# Patient Record
Sex: Female | Born: 1980 | Race: White | Hispanic: No | Marital: Married | Smoking: Never smoker
Health system: Southern US, Community
[De-identification: ages and names within clinical notes are randomized; demographics above are authoritative.]

## PROBLEM LIST (undated history)

## (undated) DIAGNOSIS — Z789 Other specified health status: Secondary | ICD-10-CM

## (undated) HISTORY — PX: AUGMENTATION MAMMAPLASTY: SUR837

## (undated) HISTORY — PX: BREAST ENHANCEMENT SURGERY: SHX7

---

## 2009-04-10 ENCOUNTER — Emergency Department (HOSPITAL_COMMUNITY): Admission: EM | Admit: 2009-04-10 | Discharge: 2009-04-10 | Payer: Self-pay | Admitting: Family Medicine

## 2009-05-22 ENCOUNTER — Emergency Department (HOSPITAL_COMMUNITY): Admission: EM | Admit: 2009-05-22 | Discharge: 2009-05-22 | Payer: Self-pay | Admitting: Family Medicine

## 2010-07-27 LAB — POCT RAPID STREP A (OFFICE): Streptococcus, Group A Screen (Direct): NEGATIVE

## 2012-02-04 ENCOUNTER — Ambulatory Visit (HOSPITAL_COMMUNITY)
Admission: RE | Admit: 2012-02-04 | Discharge: 2012-02-04 | Disposition: A | Discharge status: Home / Self Care | Payer: BC Managed Care – PPO | Source: Ambulatory Visit | Attending: Obstetrics and Gynecology | Admitting: Obstetrics and Gynecology

## 2012-02-04 NOTE — Progress Notes (Addendum)
Adult Lactation Consultation Outpatient Visit Note  Patient Name: Christy George Date of Birth: [birth year] Gestational Age at Delivery: Unknown                     66 3/7 gestational age .  Type of Delivery: per mom Vaginal delivery on 9/22 1143 am @[facility] in [place]. Pedis Dr. Dr, Renae Fickle. [facility] )                              Presents at consult today due to engorgement , breast feeding with implants , and difficult latch at time and sluggish with feedings.                             Per mom I got implants both breast ( and I don't know how they were done). I did have breast changes in the early part of my                              Pregnancy , my nipples got darker. In the hospital - I was told to supplement 20 ml after each feeding ( last day increased to 30 ml                              Until nathan got more consistent with latching.  Breastfeeding History:                                                      Birth  Weight =5-16 oz                                                                                                D/C weight = 5-11 oz                                                                                                 1st Pedis visit =5-8 oz             Per mom  milk came in Last evening.  Frequency of Breastfeeding: Per mom feeding at the breast and when "Harrold Donath" won't latch,I  feed from a Medela bottle ( slow flow)and pump  With a Medela DEBP pump.   Length of Feeding:  Voids:@least  5  Stools: 3-4 greenish today , today yellow   Supplementing / Method: Pumping:  Type of Pump:DEBP Medela( Latina )    Frequency:when the baby doesn't latch   Volume: Per mom this am right breast = 22 ml , Left= 17 ml   Comments: Mom aware due to implants and small infant and use of nipple shield she will need to do extra pumping .                      Also to use the SNS at the breast until the  baby is more consistent. ( instructed both mom and dad with demonstration.     Consultation Evaluation: Mom appears very tired with dark circles under her eyes( she mentioned no sleep since yesterday ).                                             Both breast full to mostly firm laterally and extending throughout the breast. Areola not compress able at the                                              Beginning of consult. Nipples ( right appears abrased , skin intact ( questional old scab ),left appears                                              Healthier.                                              Noted Infant to be sleepy at 1st and when changed weighed, got skin to skin started waking up .                                                Last feeding prior to consult per mom was at 0800 - 10 ml EBM and 20 ml formula in a Medela slow flow bottle.  Initial Feeding Assessment: Left breast  Pre-feed Weight:5.7.5 oz 2480g  Post-feed Weight:5.7.8 oz 2488g  Amount Transferred:8 ml ( 8-10 ml from SNS -lost some on the side of infants mouth )   Comments: Prior to latch had to "ICE" mom due to engorgement 15- 20 mins and use DEBP for 7- 10 mins with 26 ml milk yield . Breast softer, and nipple areola more compress able for latch. @ 1st attempted latched left breast after breast massage , hand express , and it was an on and off pattern. LC noted swallows with feeding but could not sustain the latch even with the LC and mom compressing the breast tissue. Joyce Copa appetizer of EBM with a Dropper challenging him to open wide and stick out his tongue, ( which he did well ). Then latched , still unable to sustain  latch. Added #20 Nipple shield and SNS and noted more consistent  pattern with increased swallows. Latched and breast fed for about 10 -12 mins and unlatched.   Additional Feeding Assessment: Relatched on left breast - still with firm nodules.  Pre-feed Weight: 5-7.8 oz 2488g  Post-feed  Weight:5.7.8 oz  2490g  Amount Transferred:28ml  Comments: Relatched with the nipple shield and not the SNS , sustained a latch but did not say latched long and only transferred 2 ml.   Additional Feeding Assessment: Right breast        - relatched again without the SNS and took 2 ml from the breast  5.8.4 oz 2506g  Pre-feed Weight:5.7.8 oz 2490g  Post-feed Weight: 5.8.3 oz 2504g  Amount Transferred:14 ml - ( 7 of milk transferred from the breast through the nipple shield - and 7ml EBM from the SNS )  Comments: Infant latched well onto the right breast with nipple shield #20 . And stayed in a consistent swallowing pattern with multiply swallows Mom reports comfort and breast softer.   Total Breast milk Transferred this Visit: 26 ml ( 11 ml from the breast and 15 ml EBM from SNS )  Total Supplement Given: 15 of EBM with SNS                                        Discussed with mom the importance of going home and getting a nap to help with letdown and engorgement tx .( see plan                                       Below.    Lactation Plan of Care: 1) Naps, rest , plenty fluids ( esp H2O ) , Nutritious snacks and meals                                        2) Skin to skin feedings                                        3) Feedings - Every 2-3 hours and on demand ( cluster feedings are normal )                                        4) Feeding Diary until Harrold Donath is back up to birth  weight and gaining well.                                        5)Sore nipple tx - Expressed milk to nipples around feedings , and in between.                                        6) Comfort gels x6 days  7) Engorgement tx Full is normal - With breast implants may need to express milk more often if Harrold Donath isn't helping you.                                       8) Goal - is to keep breast comfortable - - If greater than full and heading towards firm need to ice 15 mins and the  follow                                            Steps.                                        9) Attempt to latch without the nipple shield , if unable to apply nipple shield as shown latch , and may have to use SNS until                                             Your milk is letting down easier.                                        10 ) NIPPLE SHIELD use regards weekly weight check as long as you are needing the nipple shield for latching.                                        11) If breast still feeling full after feeding pump off the fullness.    Follow-Up- 1) Per mom Monday Sept 30 th Smart start visit                      2) Consider [facility] at [facility]                      3) 10/2 Wed. @1030  for F/U LC visit at [facility] for feeding assessment due to weight and nipple shield use.       Kathrin Greathouse 02/04/2012, 12:15 PM

## 2012-02-10 ENCOUNTER — Encounter (HOSPITAL_COMMUNITY): Payer: Self-pay

## 2012-11-18 ENCOUNTER — Other Ambulatory Visit: Payer: Self-pay | Admitting: Orthopedic Surgery

## 2012-11-21 ENCOUNTER — Other Ambulatory Visit: Payer: Self-pay | Admitting: Orthopedic Surgery

## 2012-11-21 ENCOUNTER — Encounter (HOSPITAL_COMMUNITY): Payer: Self-pay | Admitting: Pharmacy Technician

## 2012-11-22 ENCOUNTER — Encounter (HOSPITAL_COMMUNITY): Payer: Self-pay | Admitting: *Deleted

## 2012-11-22 NOTE — Progress Notes (Signed)
Pt denies SOB, chest pain, being under the care of a cardiologist, having any cardiac studies done ( EKG, stress, echo, and cardiac cath) and a recent chest x ray. Pt made aware to stop taking Aspirin and herbal medications. Do not take any NSAIDs ie: Ibuprofen, Advil, Naproxen or any medication containing Aspirin.

## 2012-11-23 ENCOUNTER — Encounter (HOSPITAL_COMMUNITY)
Admission: RE | Disposition: A | Discharge status: Home / Self Care | Payer: Self-pay | Source: Ambulatory Visit | Attending: Orthopedic Surgery

## 2012-11-23 ENCOUNTER — Ambulatory Visit: Payer: Self-pay

## 2012-11-23 ENCOUNTER — Ambulatory Visit (HOSPITAL_COMMUNITY)
Admission: RE | Admit: 2012-11-23 | Discharge: 2012-11-24 | DRG: 865 | Disposition: A | Discharge status: Home / Self Care | Payer: BC Managed Care – PPO | Source: Ambulatory Visit | Attending: Orthopedic Surgery | Admitting: Orthopedic Surgery

## 2012-11-23 ENCOUNTER — Encounter (HOSPITAL_COMMUNITY): Payer: Self-pay | Admitting: Anesthesiology

## 2012-11-23 ENCOUNTER — Ambulatory Visit (HOSPITAL_COMMUNITY): Payer: BC Managed Care – PPO

## 2012-11-23 ENCOUNTER — Encounter (HOSPITAL_COMMUNITY): Payer: Self-pay | Admitting: *Deleted

## 2012-11-23 ENCOUNTER — Ambulatory Visit (HOSPITAL_COMMUNITY): Payer: BC Managed Care – PPO | Admitting: Anesthesiology

## 2012-11-23 DIAGNOSIS — M5412 Radiculopathy, cervical region: Secondary | ICD-10-CM

## 2012-11-23 DIAGNOSIS — M5000 Cervical disc disorder with myelopathy, unspecified cervical region: Secondary | ICD-10-CM | POA: Insufficient documentation

## 2012-11-23 DIAGNOSIS — R1310 Dysphagia, unspecified: Secondary | ICD-10-CM | POA: Insufficient documentation

## 2012-11-23 HISTORY — DX: Other specified health status: Z78.9

## 2012-11-23 HISTORY — PX: ANTERIOR CERVICAL DECOMP/DISCECTOMY FUSION: SHX1161

## 2012-11-23 LAB — COMPREHENSIVE METABOLIC PANEL
Albumin: 4.3 g/dL (ref 3.5–5.2)
BUN: 13 mg/dL (ref 6–23)
Creatinine, Ser: 0.73 mg/dL (ref 0.50–1.10)
Total Protein: 7.4 g/dL (ref 6.0–8.3)

## 2012-11-23 LAB — HCG, SERUM, QUALITATIVE: Preg, Serum: NEGATIVE

## 2012-11-23 LAB — URINALYSIS, ROUTINE W REFLEX MICROSCOPIC
Bilirubin Urine: NEGATIVE
Ketones, ur: NEGATIVE mg/dL
Nitrite: NEGATIVE
pH: 5.5 (ref 5.0–8.0)

## 2012-11-23 LAB — PROTIME-INR
INR: 0.9 (ref 0.00–1.49)
Prothrombin Time: 12 seconds (ref 11.6–15.2)

## 2012-11-23 LAB — CBC WITH DIFFERENTIAL/PLATELET
Basophils Relative: 0 % (ref 0–1)
Eosinophils Absolute: 0.1 10*3/uL (ref 0.0–0.7)
HCT: 43.7 % (ref 36.0–46.0)
Hemoglobin: 15 g/dL (ref 12.0–15.0)
MCH: 31.1 pg (ref 26.0–34.0)
MCHC: 34.3 g/dL (ref 30.0–36.0)
Monocytes Absolute: 0.4 10*3/uL (ref 0.1–1.0)
Monocytes Relative: 7 % (ref 3–12)

## 2012-11-23 LAB — TYPE AND SCREEN: Antibody Screen: NEGATIVE

## 2012-11-23 SURGERY — ANTERIOR CERVICAL DECOMPRESSION/DISCECTOMY FUSION 1 LEVEL
Anesthesia: General

## 2012-11-23 MED ORDER — BISACODYL 5 MG PO TBEC: 5.0000 mg | DELAYED_RELEASE_TABLET | Freq: Every day | ORAL | Status: DC | PRN

## 2012-11-23 MED ORDER — LACTATED RINGERS IV SOLN
INTRAVENOUS | Status: DC | PRN
  Administered 2012-11-23 (×2): via INTRAVENOUS

## 2012-11-23 MED ORDER — HYDROMORPHONE HCL PF 1 MG/ML IJ SOLN
INTRAMUSCULAR | Status: AC
  Administered 2012-11-23: 0.5 mg via INTRAVENOUS
  Filled 2012-11-23: qty 1

## 2012-11-23 MED ORDER — SODIUM CHLORIDE 0.9 % IV SOLN: 250.0000 mL | INTRAVENOUS | Status: DC

## 2012-11-23 MED ORDER — PROPOFOL 10 MG/ML IV BOLUS
INTRAVENOUS | Status: DC | PRN
  Administered 2012-11-23: 140 mg via INTRAVENOUS

## 2012-11-23 MED ORDER — ZOLPIDEM TARTRATE 5 MG PO TABS: 5.0000 mg | ORAL_TABLET | Freq: Every evening | ORAL | Status: DC | PRN

## 2012-11-23 MED ORDER — SODIUM CHLORIDE 0.9 % IJ SOLN: 3.0000 mL | Freq: Two times a day (BID) | INTRAMUSCULAR | Status: DC

## 2012-11-23 MED ORDER — ONDANSETRON HCL 4 MG/2ML IJ SOLN
INTRAMUSCULAR | Status: DC | PRN
  Administered 2012-11-23: 4 mg via INTRAVENOUS

## 2012-11-23 MED ORDER — OXYCODONE HCL 5 MG PO TABS: 5.0000 mg | ORAL_TABLET | Freq: Once | ORAL | Status: DC | PRN

## 2012-11-23 MED ORDER — MENTHOL 3 MG MT LOZG: 1.0000 | LOZENGE | OROMUCOSAL | Status: DC | PRN

## 2012-11-23 MED ORDER — BUPIVACAINE-EPINEPHRINE 0.25% -1:200000 IJ SOLN
INTRAMUSCULAR | Status: DC | PRN
  Administered 2012-11-23: 4 mL

## 2012-11-23 MED ORDER — ALUM & MAG HYDROXIDE-SIMETH 200-200-20 MG/5ML PO SUSP: 30.0000 mL | Freq: Four times a day (QID) | ORAL | Status: DC | PRN

## 2012-11-23 MED ORDER — LIDOCAINE HCL (CARDIAC) 20 MG/ML IV SOLN
INTRAVENOUS | Status: DC | PRN
  Administered 2012-11-23: 60 mg via INTRAVENOUS

## 2012-11-23 MED ORDER — LACTATED RINGERS IV SOLN
INTRAVENOUS | Status: DC
  Administered 2012-11-23: 10:00:00 via INTRAVENOUS

## 2012-11-23 MED ORDER — CEFAZOLIN SODIUM-DEXTROSE 2-3 GM-% IV SOLR
INTRAVENOUS | Status: AC
  Filled 2012-11-23: qty 50

## 2012-11-23 MED ORDER — OXYCODONE HCL 5 MG/5ML PO SOLN: 5.0000 mg | Freq: Once | ORAL | Status: DC | PRN

## 2012-11-23 MED ORDER — LIDOCAINE HCL 4 % MT SOLN
OROMUCOSAL | Status: DC | PRN
  Administered 2012-11-23: 4 mL via TOPICAL

## 2012-11-23 MED ORDER — FENTANYL CITRATE 0.05 MG/ML IJ SOLN
INTRAMUSCULAR | Status: DC | PRN
Start: 2012-11-23 — End: 2012-11-23
  Administered 2012-11-23 (×6): 50 ug via INTRAVENOUS

## 2012-11-23 MED ORDER — THROMBIN 20000 UNITS EX SOLR
CUTANEOUS | Status: AC
  Filled 2012-11-23: qty 20000

## 2012-11-23 MED ORDER — 0.9 % SODIUM CHLORIDE (POUR BTL) OPTIME
TOPICAL | Status: DC | PRN
  Administered 2012-11-23: 1000 mL

## 2012-11-23 MED ORDER — DIAZEPAM 5 MG PO TABS: 5.0000 mg | ORAL_TABLET | Freq: Four times a day (QID) | ORAL | Status: DC | PRN

## 2012-11-23 MED ORDER — CEFAZOLIN SODIUM-DEXTROSE 2-3 GM-% IV SOLR
2.0000 g | INTRAVENOUS | Status: AC
  Administered 2012-11-23: 2 g via INTRAVENOUS

## 2012-11-23 MED ORDER — ONDANSETRON HCL 4 MG/2ML IJ SOLN
4.0000 mg | INTRAMUSCULAR | Status: DC | PRN
  Administered 2012-11-23 – 2012-11-24 (×2): 4 mg via INTRAVENOUS
  Filled 2012-11-23 (×2): qty 2

## 2012-11-23 MED ORDER — PROMETHAZINE HCL 25 MG/ML IJ SOLN: 6.2500 mg | INTRAMUSCULAR | Status: DC | PRN

## 2012-11-23 MED ORDER — ACETAMINOPHEN 650 MG RE SUPP: 650.0000 mg | RECTAL | Status: DC | PRN

## 2012-11-23 MED ORDER — MINERAL OIL LIGHT 100 % EX OIL
TOPICAL_OIL | CUTANEOUS | Status: DC | PRN
  Administered 2012-11-23: 1 via TOPICAL

## 2012-11-23 MED ORDER — PROPOFOL INFUSION 10 MG/ML OPTIME
INTRAVENOUS | Status: DC | PRN
  Administered 2012-11-23: 100 ug/kg/min via INTRAVENOUS

## 2012-11-23 MED ORDER — SUCCINYLCHOLINE CHLORIDE 20 MG/ML IJ SOLN
INTRAMUSCULAR | Status: DC | PRN
  Administered 2012-11-23: 90 mg via INTRAVENOUS

## 2012-11-23 MED ORDER — MORPHINE SULFATE 2 MG/ML IJ SOLN
1.0000 mg | INTRAMUSCULAR | Status: DC | PRN
  Administered 2012-11-23 – 2012-11-24 (×2): 2 mg via INTRAVENOUS
  Filled 2012-11-23 (×2): qty 1

## 2012-11-23 MED ORDER — POVIDONE-IODINE 7.5 % EX SOLN
Freq: Once | CUTANEOUS | Status: DC
Start: 2012-11-23 — End: 2012-11-23
  Filled 2012-11-23: qty 118

## 2012-11-23 MED ORDER — SENNOSIDES-DOCUSATE SODIUM 8.6-50 MG PO TABS: 1.0000 | ORAL_TABLET | Freq: Every evening | ORAL | Status: DC | PRN

## 2012-11-23 MED ORDER — BUPIVACAINE-EPINEPHRINE PF 0.25-1:200000 % IJ SOLN
INTRAMUSCULAR | Status: AC
  Filled 2012-11-23: qty 30

## 2012-11-23 MED ORDER — HYDROMORPHONE HCL PF 1 MG/ML IJ SOLN
0.2500 mg | INTRAMUSCULAR | Status: DC | PRN
  Administered 2012-11-23: 0.5 mg via INTRAVENOUS

## 2012-11-23 MED ORDER — DOCUSATE SODIUM 100 MG PO CAPS
100.0000 mg | ORAL_CAPSULE | Freq: Two times a day (BID) | ORAL | Status: DC
  Administered 2012-11-24 (×2): 100 mg via ORAL
  Filled 2012-11-23 (×4): qty 1

## 2012-11-23 MED ORDER — SODIUM CHLORIDE 0.9 % IJ SOLN: 3.0000 mL | INTRAMUSCULAR | Status: DC | PRN

## 2012-11-23 MED ORDER — MUPIROCIN 2 % EX OINT
TOPICAL_OINTMENT | CUTANEOUS | Status: AC
  Administered 2012-11-23: 11:00:00
  Filled 2012-11-23: qty 22

## 2012-11-23 MED ORDER — FLEET ENEMA 7-19 GM/118ML RE ENEM: 1.0000 | ENEMA | Freq: Once | RECTAL | Status: AC | PRN

## 2012-11-23 MED ORDER — ARTIFICIAL TEARS OP OINT
TOPICAL_OINTMENT | OPHTHALMIC | Status: DC | PRN
  Administered 2012-11-23: 1 via OPHTHALMIC

## 2012-11-23 MED ORDER — ACETAMINOPHEN 325 MG PO TABS: 650.0000 mg | ORAL_TABLET | ORAL | Status: DC | PRN

## 2012-11-23 MED ORDER — CEFAZOLIN SODIUM 1-5 GM-% IV SOLN
1.0000 g | Freq: Three times a day (TID) | INTRAVENOUS | Status: AC
  Administered 2012-11-23 – 2012-11-24 (×2): 1 g via INTRAVENOUS
  Filled 2012-11-23 (×2): qty 50

## 2012-11-23 MED ORDER — MINERAL OIL LIGHT 100 % EX OIL
TOPICAL_OIL | CUTANEOUS | Status: AC
  Filled 2012-11-23: qty 25

## 2012-11-23 MED ORDER — PHENOL 1.4 % MT LIQD
1.0000 | OROMUCOSAL | Status: DC | PRN
  Administered 2012-11-23: 1 via OROMUCOSAL
  Filled 2012-11-23: qty 177

## 2012-11-23 MED ORDER — THROMBIN 20000 UNITS EX SOLR
CUTANEOUS | Status: DC | PRN
  Administered 2012-11-23: 14:00:00

## 2012-11-23 MED ORDER — OXYCODONE-ACETAMINOPHEN 5-325 MG PO TABS
1.0000 | ORAL_TABLET | ORAL | Status: DC | PRN
  Administered 2012-11-24 (×2): 1 via ORAL
  Filled 2012-11-23 (×2): qty 1

## 2012-11-23 MED ORDER — MIDAZOLAM HCL 5 MG/5ML IJ SOLN
INTRAMUSCULAR | Status: DC | PRN
  Administered 2012-11-23 (×2): 1 mg via INTRAVENOUS

## 2012-11-23 SURGICAL SUPPLY — 76 items
ACIS Retainer Pin ×2 IMPLANT
BENZOIN TINCTURE PRP APPL 2/3 (GAUZE/BANDAGES/DRESSINGS) ×2 IMPLANT
BIT DRILL NEURO 2X3.1 SFT TUCH (MISCELLANEOUS) ×1 IMPLANT
BIT DRILL SRG 14X2.2XFLT CHK (BIT) ×1 IMPLANT
BIT DRL SRG 14X2.2XFLT CHK (BIT) ×1
BLADE SURG 15 STRL LF DISP TIS (BLADE) ×1 IMPLANT
BLADE SURG 15 STRL SS (BLADE) ×1
BLADE SURG ROTATE 9660 (MISCELLANEOUS) ×2 IMPLANT
BUR MATCHSTICK NEURO 3.0 LAGG (BURR) ×2 IMPLANT
CARTRIDGE OIL MAESTRO DRILL (MISCELLANEOUS) ×1 IMPLANT
CLOTH BEACON ORANGE TIMEOUT ST (SAFETY) ×2 IMPLANT
CLSR STERI-STRIP ANTIMIC 1/2X4 (GAUZE/BANDAGES/DRESSINGS) ×2 IMPLANT
COLLAR CERV LO CONTOUR FIRM DE (SOFTGOODS) ×2 IMPLANT
CORDS BIPOLAR (ELECTRODE) ×2 IMPLANT
COVER SURGICAL LIGHT HANDLE (MISCELLANEOUS) ×2 IMPLANT
CRADLE DONUT ADULT HEAD (MISCELLANEOUS) ×2 IMPLANT
DIFFUSER DRILL AIR PNEUMATIC (MISCELLANEOUS) ×2 IMPLANT
DRAIN JACKSON RD 7FR 3/32 (WOUND CARE) IMPLANT
DRAPE C-ARM 42X72 X-RAY (DRAPES) ×2 IMPLANT
DRAPE POUCH INSTRU U-SHP 10X18 (DRAPES) ×2 IMPLANT
DRAPE SURG 17X23 STRL (DRAPES) ×6 IMPLANT
DRILL BIT SKYLINE 14MM (BIT) ×1
DRILL NEURO 2X3.1 SOFT TOUCH (MISCELLANEOUS) ×2
DURAPREP 26ML APPLICATOR (WOUND CARE) ×2 IMPLANT
ELECT COATED BLADE 2.86 ST (ELECTRODE) ×2 IMPLANT
ELECT REM PT RETURN 9FT ADLT (ELECTROSURGICAL) ×2
ELECTRODE REM PT RTRN 9FT ADLT (ELECTROSURGICAL) ×1 IMPLANT
EVACUATOR SILICONE 100CC (DRAIN) IMPLANT
GAUZE SPONGE 4X4 16PLY XRAY LF (GAUZE/BANDAGES/DRESSINGS) ×2 IMPLANT
GLOVE BIO SURGEON STRL SZ7 (GLOVE) ×2 IMPLANT
GLOVE BIO SURGEON STRL SZ8 (GLOVE) ×2 IMPLANT
GLOVE BIOGEL PI IND STRL 7.0 (GLOVE) ×2 IMPLANT
GLOVE BIOGEL PI IND STRL 8 (GLOVE) ×1 IMPLANT
GLOVE BIOGEL PI INDICATOR 7.0 (GLOVE) ×2
GLOVE BIOGEL PI INDICATOR 8 (GLOVE) ×1
GOWN SRG XL XLNG 56XLVL 4 (GOWN DISPOSABLE) ×2 IMPLANT
GOWN STRL NON-REIN LRG LVL3 (GOWN DISPOSABLE) ×2 IMPLANT
GOWN STRL NON-REIN XL XLG LVL4 (GOWN DISPOSABLE) ×2
IMPL S ENDOSKEL TC 7 ODEG (Orthopedic Implant) ×1 IMPLANT
IMPLANT S ENDOSKEL TC 7 ODEG (Orthopedic Implant) ×2 IMPLANT
IV CATH 14GX2 1/4 (CATHETERS) ×2 IMPLANT
KIT BASIN OR (CUSTOM PROCEDURE TRAY) ×2 IMPLANT
KIT ROOM TURNOVER OR (KITS) ×2 IMPLANT
MANIFOLD NEPTUNE II (INSTRUMENTS) ×2 IMPLANT
NEEDLE 27GAX1X1/2 (NEEDLE) ×2 IMPLANT
NEEDLE SPNL 20GX3.5 QUINCKE YW (NEEDLE) ×2 IMPLANT
NS IRRIG 1000ML POUR BTL (IV SOLUTION) ×2 IMPLANT
OIL CARTRIDGE MAESTRO DRILL (MISCELLANEOUS) ×2
PACK ORTHO CERVICAL (CUSTOM PROCEDURE TRAY) ×2 IMPLANT
PAD ARMBOARD 7.5X6 YLW CONV (MISCELLANEOUS) ×4 IMPLANT
PATTIES SURGICAL .5 X.5 (GAUZE/BANDAGES/DRESSINGS) IMPLANT
PATTIES SURGICAL .5 X1 (DISPOSABLE) IMPLANT
PIN DISTRACTION 12MM (PIN) ×2
PIN DSTRCT 12XNS SS ACIS (PIN) ×2 IMPLANT
PLATE SKYLINE 12MM (Plate) ×2 IMPLANT
PUTTY BONE DBX 2.5 MIS (Bone Implant) ×2 IMPLANT
SCREW TAPPING SKYLINE 12MM (Screw) ×8 IMPLANT
SPONGE GAUZE 4X4 12PLY (GAUZE/BANDAGES/DRESSINGS) ×2 IMPLANT
SPONGE INTESTINAL PEANUT (DISPOSABLE) ×4 IMPLANT
SPONGE SURGIFOAM ABS GEL 100 (HEMOSTASIS) ×2 IMPLANT
STRIP CLOSURE SKIN 1/2X4 (GAUZE/BANDAGES/DRESSINGS) ×2 IMPLANT
SURGIFLO TRUKIT (HEMOSTASIS) IMPLANT
SUT MNCRL AB 4-0 PS2 18 (SUTURE) ×2 IMPLANT
SUT SILK 4 0 (SUTURE)
SUT SILK 4-0 18XBRD TIE 12 (SUTURE) IMPLANT
SUT VIC AB 2-0 CT2 18 VCP726D (SUTURE) ×4 IMPLANT
SYR BULB IRRIGATION 50ML (SYRINGE) ×2 IMPLANT
SYR CONTROL 10ML LL (SYRINGE) ×4 IMPLANT
Skyline drill ×2 IMPLANT
TAPE CLOTH 4X10 WHT NS (GAUZE/BANDAGES/DRESSINGS) ×2 IMPLANT
TAPE CLOTH SURG 4X10 WHT LF (GAUZE/BANDAGES/DRESSINGS) ×2 IMPLANT
TAPE UMBILICAL COTTON 1/8X30 (MISCELLANEOUS) ×2 IMPLANT
TOWEL OR 17X24 6PK STRL BLUE (TOWEL DISPOSABLE) ×2 IMPLANT
TOWEL OR 17X26 10 PK STRL BLUE (TOWEL DISPOSABLE) ×2 IMPLANT
WATER STERILE IRR 1000ML POUR (IV SOLUTION) ×2 IMPLANT
YANKAUER SUCT BULB TIP NO VENT (SUCTIONS) ×2 IMPLANT

## 2012-11-23 NOTE — Anesthesia Postprocedure Evaluation (Signed)
Anesthesia Post Note  Patient: Christy George  Procedure(s) Performed: Procedure(s) (LRB): ANTERIOR CERVICAL DECOMPRESSION/DISCECTOMY FUSION 1 LEVEL (N/A)  Anesthesia type: general  Patient location: PACU  Post pain: Pain level controlled  Post assessment: Patient's Cardiovascular Status Stable  Last Vitals:  Filed Vitals:   11/23/12 1745  BP: 120/77  Pulse:   Temp: 36.2 C  Resp:     Post vital signs: Reviewed and stable  Level of consciousness: sedated  Complications: No apparent anesthesia complications

## 2012-11-23 NOTE — Transfer of Care (Signed)
Immediate Anesthesia Transfer of Care Note  Patient: Christy George  Procedure(s) Performed: Procedure(s) with comments: ANTERIOR CERVICAL DECOMPRESSION/DISCECTOMY FUSION 1 LEVEL (N/A) - Anterior cervical decompression fusion cervical 6-7 with instrumentation and allograft  Patient Location: PACU  Anesthesia Type:General  Level of Consciousness: awake, oriented and patient cooperative  Airway & Oxygen Therapy: Patient Spontanous Breathing and Patient connected to nasal cannula oxygen  Post-op Assessment: Report given to PACU RN and Post -op Vital signs reviewed and stable  Post vital signs: Reviewed and stable  Complications: No apparent anesthesia complications

## 2012-11-23 NOTE — Anesthesia Procedure Notes (Signed)
Procedure Name: Intubation Date/Time: 11/23/2012 1:11 PM Performed by: Gayla Medicus Pre-anesthesia Checklist: Patient identified, Timeout performed, Emergency Drugs available, Suction available and Patient being monitored Patient Re-evaluated:Patient Re-evaluated prior to inductionOxygen Delivery Method: Circle system utilized Preoxygenation: Pre-oxygenation with 100% oxygen Intubation Type: IV induction Ventilation: Mask ventilation without difficulty Tube size: 7.0 mm Number of attempts: 1 Airway Equipment and Method: Stylet Placement Confirmation: positive ETCO2 and breath sounds checked- equal and bilateral Secured at: 21 cm Tube secured with: Tape Dental Injury: Teeth and Oropharynx as per pre-operative assessment  Comments: Atraumatic blind oral intubation performed by Dr. Jacklynn Bue. Neck maintained in neutral position throughout, +BBSE, +ETCO2.

## 2012-11-23 NOTE — Preoperative (Signed)
Beta Blockers   Reason not to administer Beta Blockers:Not Applicable 

## 2012-11-23 NOTE — H&P (Signed)
PREOPERATIVE H&P  Chief Complaint: sever left arm pain  HPI: Christy George is a 32 y.o. female who presents with ongoing and sever left arm pain. MRi reveals large C6/7 HNP causing severe compression of the spinal cord at this level.  Past Medical History  Diagnosis Date  . Medical history non-contributory    Past Surgical History  Procedure Laterality Date  . Breast enhancement surgery      hx; of 1999   History   Social History  . Marital Status: Married    Spouse Name: N/A    Number of Children: N/A  . Years of Education: N/A   Social History Main Topics  . Smoking status: Never Smoker   . Smokeless tobacco: Never Used  . Alcohol Use: Yes     Comment: occasional  . Drug Use: No  . Sexually Active: Not Currently   Other Topics Concern  . None   Social History Narrative  . None   Family History  Problem Relation Age of Onset  . Cervical cancer Other    No Known Allergies Prior to Admission medications   Medication Sig Start Date End Date Taking? Authorizing Provider  acetaminophen-codeine (TYLENOL #3) 300-30 MG per tablet Take 1 tablet by mouth every 4 (four) hours as needed for pain.   Yes Historical Provider, MD  HYDROcodone-acetaminophen (NORCO/VICODIN) 5-325 MG per tablet Take 1 tablet by mouth at bedtime as needed for pain.   Yes Historical Provider, MD     All other systems have been reviewed and were otherwise negative with the exception of those mentioned in the HPI and as above.  Physical Exam: There were no vitals filed for this visit.  General: Alert, no acute distress Cardiovascular: No pedal edema Respiratory: No cyanosis, no use of accessory musculature GI: No organomegaly, abdomen is soft and non-tender Skin: No lesions in the area of chief complaint Neurologic: Sensation intact distally, weakness noted at left UE Psychiatric: Patient is competent for consent with normal mood and affect Lymphatic: No axillary or cervical  lymphadenopathy  MUSCULOSKELETAL: pain with ROM C spine  Assessment/Plan: Left arm pain, spinal cord compression Plan for Procedure(s): ANTERIOR CERVICAL DECOMPRESSION/DISCECTOMY FUSION C6/7   [name], MD 11/23/2012 8:03 AM

## 2012-11-23 NOTE — Anesthesia Preprocedure Evaluation (Signed)
Anesthesia Evaluation  Patient identified by MRN, date of birth, ID band Patient awake    Reviewed: Allergy & Precautions, H&P , NPO status , Patient's Chart, lab work & pertinent test results  History of Anesthesia Complications Negative for: history of anesthetic complications  Airway Mallampati: II  Neck ROM: limited    Dental no notable dental hx. (+) Teeth Intact   Pulmonary neg pulmonary ROS,  breath sounds clear to auscultation  Pulmonary exam normal       Cardiovascular negative cardio ROS  IRhythm:regular Rate:Normal     Neuro/Psych negative neurological ROS  negative psych ROS   GI/Hepatic negative GI ROS, Neg liver ROS,   Endo/Other  negative endocrine ROS  Renal/GU negative Renal ROS  negative genitourinary   Musculoskeletal   Abdominal   Peds  Hematology negative hematology ROS (+)   Anesthesia Other Findings   Reproductive/Obstetrics negative OB ROS                           Anesthesia Physical Anesthesia Plan  ASA: I  Anesthesia Plan: General and General ETT   Post-op Pain Management:    Induction: Intravenous  Airway Management Planned: Oral ETT  Additional Equipment:   Intra-op Plan:   Post-operative Plan: Extubation in OR  Informed Consent: I have reviewed the patients History and Physical, chart, labs and discussed the procedure including the risks, benefits and alternatives for the proposed anesthesia with the patient or authorized representative who has indicated his/her understanding and acceptance.   Dental advisory given  Plan Discussed with: CRNA and Surgeon  Anesthesia Plan Comments:         Anesthesia Quick Evaluation

## 2012-11-24 NOTE — Op Note (Signed)
NAME:  LOIS, SLAGEL NO.:  [id number]  MEDICAL RECORD NO.:  [id number]  LOCATION:  [id number]                        FACILITY:  [facility]  PHYSICIAN:  Estill Bamberg, MD      DATE OF BIRTH:  [birth year]  DATE OF PROCEDURE:  11/23/2012 DATE OF DISCHARGE:                              OPERATIVE REPORT   PREOPERATIVE DIAGNOSES: 1. Left-sided cervical radiculopathy. 2. 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  POSTOPERATIVE DIAGNOSES: 1. Left-sided cervical radiculopathy. 2. 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  PROCEDURES: 1. Anterior cervical decompression fusion, C6-7. 2. Placement of anterior instrumentation, C6-7. 3. Insertion of interbody device x1 (6 mm parallel small Titan     interbody cage). 4. Use of morselized allograft. 5. Intraoperative use of fluoroscopy.  SURGEON:  Estill Bamberg, MD  ASSISTANT:  Jason Coop, PA-C  ANESTHESIA:  General endotracheal anesthesia.  COMPLICATIONS:  None.  DISPOSITION:  Stable.  ESTIMATED BLOOD LOSS:  Minimal.  INDICATIONS FOR PROCEDURE:  Briefly, Ms. Glasser is a pleasant 32 year old female who did initially present to me with severe pain in her left arm.  I did ultimately obtain an MRI, which was notable for a very large disk herniation, causing severe compression of the spinal cord.  Given nature of the findings, we did have a discussion regarding operative intervention.  She did understand the risks and limitations of the procedure.  OPERATIVE DETAILS:  On November 23, 2012, the patient was brought to the surgery and general endotracheal anesthesia was administered.  The patient was placed supine on a hospital bed.  All bony prominences were meticulously padded.  The neck was placed in a gentle degree of extension.  Of note, neurologic monitoring was used throughout the procedure.  Baseline motor-evoked potentials  were obtained.  There was motor function noted at the bilateral lower extremities.  The neck was then prepped and draped.  An incision was then made from the midline to the medial border of the sternocleidomastoid muscle.  The platysma was sharply incised.  The plane between the sternocleidomastoid muscle and the strap muscles were identified and explored.  The carotid artery was identified and palpated and retracted laterally.  The anterior cervical spine was readily noted.  I then subperiosteally exposed the vertebral bodies of C6 and C7.  Self-retaining Shadow-Line retractor was placed. A 15 blade knife was used to perform an annulotomy anteriorly.  I then placed a 12-mm Caspar pins into the C6 and C7 vertebral bodies.  Gentle distraction was applied.  I then went forward with a thorough diskectomy.  Upon approaching the posterior aspect of the intervertebral space, I did use a nerve hook and a very large disk protrusion was noted.  I did use #1 Kerrison and one very large disk fragment did readily to clear itself.  It was located behind the C6 and C7 vertebral bodies.  This maneuver was attempted again and an additional very large disk fragment was removed.  I then used a nerve hook to gain access to the posterior longitudinal ligament.  I then performed a thorough neural  foraminal decompression on both the right and the left sides.  I was very pleased with the final decompression.  I then prepared the endplates using a series of curettes.  I then placed a series of trials and I did feel that a 6-mm trial would be the most appropriate fit.  I then filled a 6-mm parallel interbody implant with DBX mix.  The implant was tamped into position in the usual fashion.  Distraction was then discontinued and the Caspar pins were removed.  Bone wax was placed in their place.  I did countersink the graft by approximately 2 mm.  I did obtain a lateral intraoperative view and I was very pleased with  the appearance of the implant.  I then applied a 12-mm anterior cervical plate.  12-mm screws were placed into the C6 and C7 vertebral bodies, two in each vertebral body for a total of 4 screws.  I was very pleased with the final purchase of the screws.  I did use the locking mechanism to ensure appropriate locking of the screws.  I then copiously irrigated the wound.  I then explored the wound for any undue bleeding and there was none.  I then closed the platysma using 2-0 Vicryl.  The skin was then closed using 3-0 Monocryl.  Benzoin and Steri-Strips were then applied followed by sterile dressing.  All instrument counts were correct at the termination of the procedure.  Of note, I did use neurologic monitoring and I did obtain a motor-evoked potentials throughout the surgery and there was no change from baseline.  Of note, Jason Coop was my assistant throughout the entirety of the procedure and did aid in essential retraction and suctioning required throughout the surgery.     Estill Bamberg, MD     MD/MEDQ  D:  11/23/2012  T:  11/24/2012  Job:  [id number]  cc:   Marlou Starks, MD

## 2012-11-24 NOTE — Progress Notes (Signed)
Discussed discharge instructions with pt including how and when to call the dr, follow up appt, s/s of infx, wound care, medications, activity, and diet. Pt verbalized understanding and denied any questions.  Juliane Lack, RN

## 2012-11-24 NOTE — Progress Notes (Signed)
UR COMPLETED  

## 2012-11-24 NOTE — Progress Notes (Signed)
Patient doing well. Left arm pain resolved.  Ad n/v overnight. Swallowing difficulty noted.  BP 123/76  Pulse 86  Temp(Src) 98.3 F (36.8 C) (Oral)  Resp 20  Ht 4\' 11"  (1.499 m)  Wt 45.099 kg (99 lb 6.8 oz)  BMI 20.07 kg/m2  SpO2 99%  LMP 11/08/2012  NVI Dressing CDI  POD #1 after c6/7 acdf with removal of large disc herniation  - soft collar at all times - advance diet as tolerated - may d/c home if swallowing improves

## 2012-11-29 ENCOUNTER — Encounter (HOSPITAL_COMMUNITY): Payer: Self-pay | Admitting: Orthopedic Surgery

## 2012-12-14 NOTE — Discharge Summary (Signed)
Patient ID: Christy George MRN: [id number] DOB/AGE: 1980/08/02 32 y.o.  Admit date: 11/23/2012 Discharge date: 11/24/2012  Admission Diagnoses: Myeloradiculopathy    Discharge Diagnoses: Improved S/P C6-7 ACDF  Past Medical History  Diagnosis Date  . Medical history non-contributory     Surgeries: Procedure(s): ANTERIOR CERVICAL DECOMPRESSION/DISCECTOMY FUSION 1 LEVEL C6-7 on 11/23/2012   Consultants: None  Discharged Condition: Improved  Hospital Course: Christy George is an 32 y.o. female who was admitted 11/23/2012 for operative treatment of myeloradiculopathy. Patient has severe unremitting pain that affects sleep, daily activities, and work/hobbies. After pre-op clearance the patient was taken to the operating room on 11/23/2012 and underwent  Procedure(s): ANTERIOR CERVICAL DECOMPRESSION/DISCECTOMY FUSION 1 LEVEL C6-7.    Patient was given perioperative antibiotics: Anti-infectives   Start     Dose/Rate Route Frequency Ordered Stop   11/23/12 2100  ceFAZolin (ANCEF) IVPB 1 g/50 mL premix     1 g 100 mL/hr over 30 Minutes Intravenous Every 8 hours 11/23/12 1759 11/24/12 0514   11/23/12 0947  ceFAZolin (ANCEF) 2-3 GM-% IVPB SOLR    Comments:  TODD, ROBERT: cabinet override      11/23/12 0947 11/23/12 2159   11/23/12 0938  ceFAZolin (ANCEF) IVPB 2 g/50 mL premix     2 g 100 mL/hr over 30 Minutes Intravenous On call to O.R. 11/23/12 4098 11/23/12 1320       Patient was given sequential compression devices, early ambulation to prevent DVT.  Patient benefited maximally from hospital stay and there were no complications.    Recent vital signs: BP 123/76  Pulse 86  Temp(Src) 98.3 F (36.8 C) (Oral)  Resp 20  Ht 4\' 11"  (1.499 m)  Wt 45.099 kg (99 lb 6.8 oz)  BMI 20.07 kg/m2  SpO2 99%  LMP 11/08/2012    Discharge Medications:     Medication List    STOP taking these medications       acetaminophen-codeine 300-30 MG per tablet  Commonly known as:  TYLENOL #3      HYDROcodone-acetaminophen 5-325 MG per tablet  Commonly known as:  NORCO/VICODIN        Diagnostic Studies: Dg Chest 2 View  11/23/2012   *RADIOLOGY REPORT*  Clinical Data: Preop for lumbar spine surgery.  CHEST - 2 VIEW  Comparison: None  Findings: The cardiac silhouette, mediastinal and hilar contours are within normal limits.  The lungs are clear.  No pleural effusion.  The bony thorax is intact.  IMPRESSION: No acute cardiopulmonary findings.   Original Report Authenticated By: Rudie Meyer, M.D.   Dg Cervical Spine 2-3 Views  11/23/2012   *RADIOLOGY REPORT*  Clinical Data: Cervical fusion.  DG C-ARM 1-60 MIN, CERVICAL SPINE - 2-3 VIEW  Technique: Two fluoroscopic intraoperative views of the cervical spine are provided.  Comparison:  MR cervical spine 11/17/2012.  Findings: Provided image demonstrates anterior plate and screws and interbody spacer at C6-7. Vertebral body height and alignment appear maintained.  IMPRESSION: C6-7 ACDF.   Original Report Authenticated By: Holley Dexter, M.D.   Dg Lumbar Spine 2-3 Views  11/23/2012   *RADIOLOGY REPORT*  Clinical Data: Preop for lumbar surgery.  LUMBAR SPINE - 2-3 VIEW  Comparison: None  Findings: There are five non-rib bearing lumbar type vertebral bodies.  Normal alignment on the lateral film.  Disc spaces and vertebral bodies are maintained.  The facets are normally aligned. No obvious pars defects.  The visualized bony pelvis is intact.  IMPRESSION: Normal alignment and no acute bony  findings or degenerative changes.   Original Report Authenticated By: Rudie Meyer, M.D.   Dg C-arm 1-60 Min  11/23/2012   *RADIOLOGY REPORT*  Clinical Data: Cervical fusion.  DG C-ARM 1-60 MIN, CERVICAL SPINE - 2-3 VIEW  Technique: Two fluoroscopic intraoperative views of the cervical spine are provided.  Comparison:  MR cervical spine 11/17/2012.  Findings: Provided image demonstrates anterior plate and screws and interbody spacer at C6-7. Vertebral body  height and alignment appear maintained.  IMPRESSION: C6-7 ACDF.   Original Report Authenticated By: Holley Dexter, M.D.   Dg Outside Films Spine  11/23/2012   This examination belongs to an outside facility and is stored  here for comparison purposes only.  Contact the originating outside  institution for any associated report or interpretation.   Disposition: 01-Home or Self Care      Discharge Orders   Future Orders Complete By Expires     Discharge patient  As directed     Comments:      If swallowing improves       Patient doing well. Left arm pain resolved.  Ad n/v overnight. Swallowing difficulty noted.   POD #1 after C6/7 acdf with removal of large disc herniation  - soft collar at all times  - advance diet as tolerated  - may d/c home if swallowing improves -F/u in office 2 weeks  -D/C on percocet and valium, written scripts in chart   Signed: Georga Bora 12/14/2012, 10:49 AM

## 2014-01-18 ENCOUNTER — Telehealth: Payer: Self-pay | Admitting: Interventional Cardiology

## 2014-01-18 NOTE — Telephone Encounter (Signed)
error 

## 2014-01-18 NOTE — Telephone Encounter (Signed)
New message ° ° ° ° ° ° °Pt returning nurse call  °

## 2014-02-15 ENCOUNTER — Ambulatory Visit (INDEPENDENT_AMBULATORY_CARE_PROVIDER_SITE_OTHER): Payer: BC Managed Care – PPO | Admitting: Podiatry

## 2014-02-15 ENCOUNTER — Encounter: Payer: Self-pay | Admitting: Podiatry

## 2014-02-15 VITALS — BP 110/78 | HR 65 | Resp 17

## 2014-02-15 DIAGNOSIS — M779 Enthesopathy, unspecified: Secondary | ICD-10-CM

## 2014-02-15 NOTE — Progress Notes (Signed)
   Subjective:    Patient ID: Christy George, female    DOB: 09-Sep-1980, 33 y.o.   MRN: [id number]  HPI Pt wants new orthotics   Review of Systems  All other systems reviewed and are negative.      Objective:   Physical Exam        Assessment & Plan:

## 2014-02-16 NOTE — Progress Notes (Signed)
Subjective:     Patient ID: Christy George, female   DOB: [birth year], 33 y.o.   MRN: [id number]  HPI patient states that her orthotics have started to wear out and she wants and  8   Review of Systems     Objective:   Physical Exam Neurovascular status intact with mild tendinitis-like symptoms secondary to foot    Assessment:     Tendinitis feet in general    Plan:     Reviewed foot conditions and the importance of wearing supportive shoes and  went ahead today and scanned for custom orthotic devices

## 2014-04-16 ENCOUNTER — Telehealth: Payer: Self-pay | Admitting: *Deleted

## 2014-04-16 NOTE — Telephone Encounter (Signed)
Left message that orthotics had arrived and if she was only picking up 2nd pair without any current problems, could pick-up with out an appt.

## 2014-04-19 ENCOUNTER — Encounter: Payer: Self-pay | Admitting: Podiatry

## 2015-06-03 IMAGING — CR DG LUMBAR SPINE 2-3V
2 series · 2 of 2 positions shown · non-contrast
Comparison: None

CLINICAL DATA: Preop for lumbar surgery.

LUMBAR SPINE - 2-3 VIEW

[t lumbar spine ap]
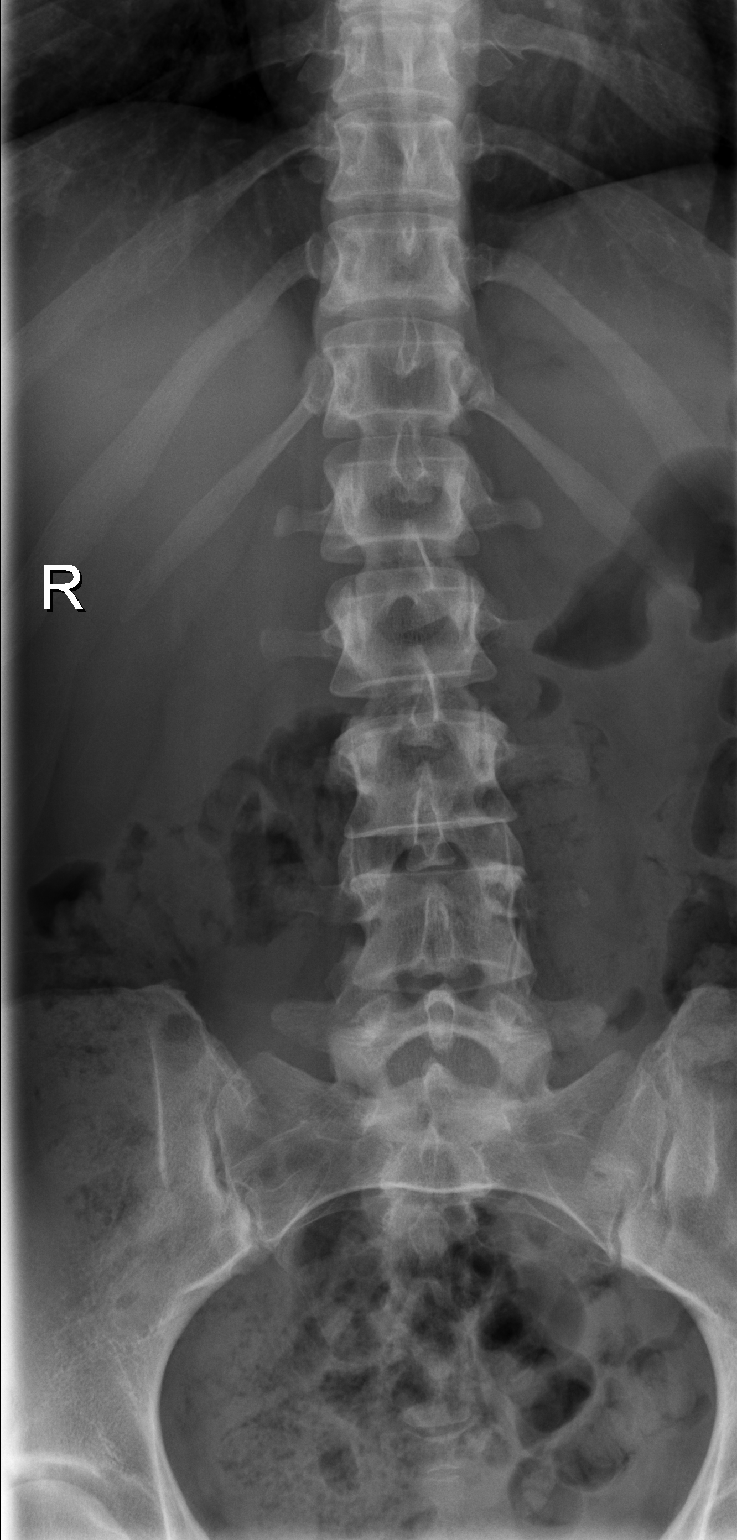

[t lumbar spine lat]
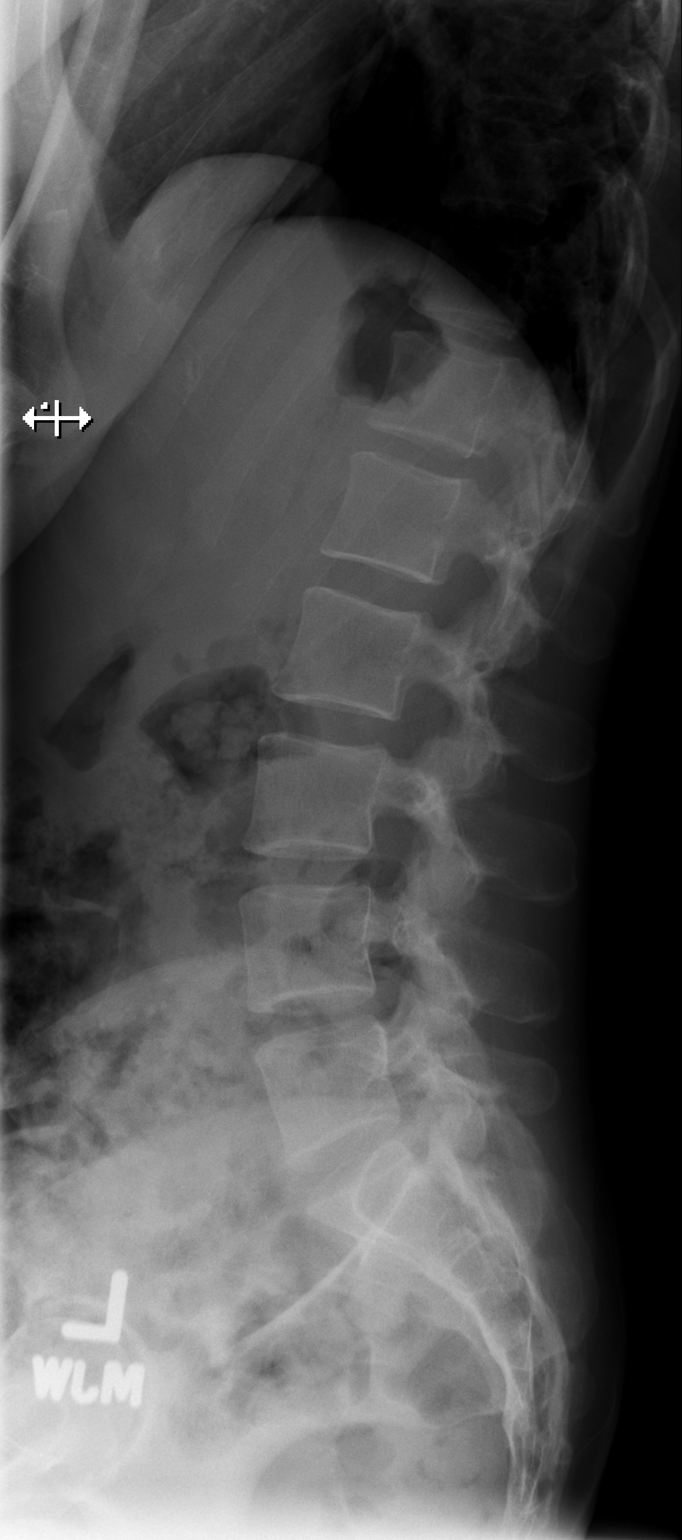

[2 of 2 positions shown; findings below may reference images not displayed]

FINDINGS: There are five non-rib bearing lumbar type vertebral
bodies.  Normal alignment on the lateral film.  Disc spaces and
vertebral bodies are maintained.  The facets are normally aligned.
No obvious pars defects.  The visualized bony pelvis is intact.
IMPRESSION: Normal alignment and no acute bony findings or degenerative
changes.

## 2017-07-08 ENCOUNTER — Ambulatory Visit: Payer: Self-pay | Admitting: Podiatry

## 2017-07-22 ENCOUNTER — Encounter: Payer: Self-pay | Admitting: Podiatry

## 2017-07-22 ENCOUNTER — Ambulatory Visit: Payer: BLUE CROSS/BLUE SHIELD | Admitting: Podiatry

## 2017-07-22 VITALS — BP 117/74 | HR 78

## 2017-07-22 DIAGNOSIS — M779 Enthesopathy, unspecified: Secondary | ICD-10-CM | POA: Diagnosis not present

## 2017-07-22 DIAGNOSIS — Q828 Other specified congenital malformations of skin: Secondary | ICD-10-CM | POA: Diagnosis not present

## 2017-07-22 DIAGNOSIS — M722 Plantar fascial fibromatosis: Secondary | ICD-10-CM

## 2017-07-26 NOTE — Progress Notes (Signed)
Subjective:   Patient ID: Christy George, female   DOB: 37 y.o.   MRN: [id number]   HPI Christy George presents the office today requesting new orthotics.  Her orthotics are several years old at this point should have a new pair.  She also has a painful callus on the bottom of her right ball of her foot which is painful with pressure in shoes as it becomes thick.  She denies any redness or drainage or any swelling.  She has no other concerns today.   Review of Systems  All other systems reviewed and are negative.  Past Medical History:  Diagnosis Date  . Medical history non-contributory     Past Surgical History:  Procedure Laterality Date  . ANTERIOR CERVICAL DECOMP/DISCECTOMY FUSION N/A 11/23/2012   Procedure: ANTERIOR CERVICAL DECOMPRESSION/DISCECTOMY FUSION 1 LEVEL;  Surgeon: [name], MD;  Location: [facility] OR;  Service: Orthopedics;  Laterality: N/A;  Anterior cervical decompression fusion cervical 6-7 with instrumentation and allograft  . BREAST ENHANCEMENT SURGERY     hx; of 1999    No current outpatient medications on file.  No Known Allergies  Social History   Socioeconomic History  . Marital status: Married    Spouse name: Not on file  . Number of children: Not on file  . Years of education: Not on file  . Highest education level: Not on file  Social Needs  . Financial resource strain: Not on file  . Food insecurity - worry: Not on file  . Food insecurity - inability: Not on file  . Transportation needs - medical: Not on file  . Transportation needs - non-medical: Not on file  Occupational History  . Not on file  Tobacco Use  . Smoking status: Never Smoker  . Smokeless tobacco: Never Used  Substance and Sexual Activity  . Alcohol use: Yes    Comment: occasional  . Drug use: No  . Sexual activity: Not Currently  Other Topics Concern  . Not on file  Social History Narrative  . Not on file        Objective:  Physical Exam  General: AAO x3,  NAD  Dermatological: Punctate annular hyperkeratotic lesion present on the right side metatarsal 3.  Upon debridement there is no underlying ulceration, drainage or any signs of infection present.  No other open lesions or pre-ulcerative lesions.  Vascular: Dorsalis Pedis artery and Posterior Tibial artery pedal pulses are 2/4 bilateral with immedate capillary fill time.  There is no pain with calf compression, swelling, warmth, erythema.   Neruologic: Grossly intact via light touch bilateral.  Protective threshold with Semmes Wienstein monofilament intact to all pedal sites bilateral.   Musculoskeletal: At this time there is no tenderness palpation to bilateral feet.  Subjectively upon palpation of the plantar fascia to get discomfort occasionally but currently no expansion any tenderness.  Ankle, subtalar joint range of motion intact without any restrictions.  There is no overlying edema, erythema, increase in warmth.  Muscular strength 5/5 in all groups tested bilateral.  Gait: Unassisted, Nonantalgic.       Assessment:   37 year old female with a porokeratosis right foot, history of tendinitis/plantar fasciitis     Plan:  -Treatment options discussed including all alternatives, risks, and complications -Etiology of symptoms were discussed -Sharply debrided hyperkeratotic lesion x1 without any complications or bleeding. -Custom molded orthotic discussed.  She wished to proceed.  She has multiple orthotics today.  We will try to offload the hyperkeratotic lesion on the right  foot. -RTC 3-4 weeks to pick up orthotics or sooner if needed.  Call any questions or concerns.  Vivi BarrackMatthew R Wagoner DPM

## 2017-08-02 ENCOUNTER — Ambulatory Visit: Payer: BLUE CROSS/BLUE SHIELD | Admitting: Orthotics

## 2017-08-02 DIAGNOSIS — M779 Enthesopathy, unspecified: Secondary | ICD-10-CM

## 2017-08-02 DIAGNOSIS — Q828 Other specified congenital malformations of skin: Secondary | ICD-10-CM

## 2017-08-02 DIAGNOSIS — M722 Plantar fascial fibromatosis: Secondary | ICD-10-CM

## 2017-08-02 NOTE — Progress Notes (Signed)
Patient came into today to be cast for Custom Foot Orthotics. Upon recommendation of Dr. Ardelle AntonWagoner  Patient presents with painful porokeraosis sub 3 R Goals are arch support, off load [facility]uk [facility] [employer]

## 2017-08-16 ENCOUNTER — Ambulatory Visit: Payer: BLUE CROSS/BLUE SHIELD | Admitting: Podiatry

## 2017-08-23 ENCOUNTER — Other Ambulatory Visit: Payer: BLUE CROSS/BLUE SHIELD | Admitting: Orthotics

## 2017-08-30 ENCOUNTER — Ambulatory Visit: Payer: BLUE CROSS/BLUE SHIELD | Admitting: Orthotics

## 2017-08-30 DIAGNOSIS — Q828 Other specified congenital malformations of skin: Secondary | ICD-10-CM

## 2017-08-30 DIAGNOSIS — M779 Enthesopathy, unspecified: Secondary | ICD-10-CM

## 2017-08-30 DIAGNOSIS — M722 Plantar fascial fibromatosis: Secondary | ICD-10-CM

## 2018-11-04 ENCOUNTER — Other Ambulatory Visit: Payer: Self-pay | Admitting: Internal Medicine

## 2018-11-09 LAB — NOVEL CORONAVIRUS, NAA: SARS-CoV-2, NAA: NOT DETECTED

## 2018-11-10 NOTE — Progress Notes (Signed)
Spoke with patient and advised of negative coronavirus test result.

## 2018-11-10 NOTE — Progress Notes (Addendum)
Spoke with patient and advised of negative coronavirus test result.

## 2023-01-15 ENCOUNTER — Encounter: Payer: Self-pay | Admitting: Plastic Surgery

## 2023-01-15 ENCOUNTER — Ambulatory Visit (INDEPENDENT_AMBULATORY_CARE_PROVIDER_SITE_OTHER): Payer: Self-pay | Admitting: Plastic Surgery

## 2023-01-15 VITALS — BP 136/80 | HR 77 | Ht 59.0 in | Wt 106.4 lb

## 2023-01-15 DIAGNOSIS — L989 Disorder of the skin and subcutaneous tissue, unspecified: Secondary | ICD-10-CM

## 2023-01-15 DIAGNOSIS — L0100 Impetigo, unspecified: Secondary | ICD-10-CM

## 2023-01-15 NOTE — Progress Notes (Signed)
Patient ID: Christy George, female    DOB: [birth year], 42 y.o.   MRN: [id number]   Chief Complaint  Patient presents with   Advice Only    The patient is a 42 year old female here for evaluation of her face.  Several months ago around June she got impetigo which reach havoc on her face.  The impetigo has resolved.  She now has a facial lesion that is fairly large on the right lower cheek area.  She would like to see if this could be removed.  I think that is reasonable given the history.  She is otherwise in good health.  She had cervical fusion in the past and did well with that.  She also mentions swelling under her lower eyelids.  I would like her to see her PCP and make sure there is not anything else going on prior to doing anything further about that.    Review of Systems  Constitutional: Negative.   HENT: Negative.    Eyes: Negative.   Respiratory: Negative.  Negative for chest tightness.   Cardiovascular: Negative.   Gastrointestinal: Negative.   Endocrine: Negative.   Genitourinary: Negative.   Musculoskeletal: Negative.     Past Medical History:  Diagnosis Date   Medical history non-contributory     Past Surgical History:  Procedure Laterality Date   ANTERIOR CERVICAL DECOMP/DISCECTOMY FUSION N/A 11/23/2012   Procedure: ANTERIOR CERVICAL DECOMPRESSION/DISCECTOMY FUSION 1 LEVEL;  Surgeon: [name], MD;  Location: [facility] OR;  Service: Orthopedics;  Laterality: N/A;  Anterior cervical decompression fusion cervical 6-7 with instrumentation and allograft   BREAST ENHANCEMENT SURGERY     hx; of 1999      Current Outpatient Medications:    amoxicillin (AMOXIL) 875 MG tablet, Take 875 mg by mouth 2 (two) times daily., Disp: , Rfl:    Ergocalciferol 10 MCG (400 UNIT) TABS, Take by mouth., Disp: , Rfl:    FLUoxetine (PROZAC) 20 MG capsule, Take 20 mg by mouth daily., Disp: , Rfl:    levonorgestrel (MIRENA, 52 MG,) 20 MCG/DAY IUD, by Intrauterine route., Disp: ,  Rfl:    MAGNESIUM GLUCONATE PO, Take 900 mg by mouth., Disp: , Rfl:    NP THYROID 15 MG tablet, Take 15 mg by mouth every morning., Disp: , Rfl:    NP THYROID 30 MG tablet, Take 30 mg by mouth daily., Disp: , Rfl:    Testosterone 20 % CREA, apply 0.5 gram (2 clicks) topically once a day behind the knee. Decrease to 1 click for oily skin, acne, or hair growth, Disp: , Rfl:    Objective:   Vitals:   01/15/23 1429  BP: 136/80  Pulse: 77  SpO2: 99%    Physical Exam Vitals and nursing note reviewed.  Constitutional:      Appearance: Normal appearance.  HENT:     Head: Atraumatic.  Musculoskeletal:        General: No swelling or tenderness.  Skin:    General: Skin is warm.     Capillary Refill: Capillary refill takes less than 2 seconds.     Coloration: Skin is not jaundiced.     Findings: Lesion present. No bruising.  Neurological:     Mental Status: She is alert and oriented to person, place, and time.  Psychiatric:        Mood and Affect: Mood normal.        Behavior: Behavior normal.        Thought Content:  Thought content normal.        Judgment: Judgment normal.     Assessment & Plan:  Impetigo  Skin lesion of face  Plan for excision of skin lesion we should be able to do that in the office.  Pictures were obtained of the patient and placed in the chart with the patient's or guardian's permission.  Alena Bills Anthonee Gelin, DO

## 2023-02-08 ENCOUNTER — Ambulatory Visit: Payer: Self-pay | Admitting: Plastic Surgery

## 2023-02-12 ENCOUNTER — Ambulatory Visit: Payer: Self-pay | Admitting: Plastic Surgery

## 2023-03-30 ENCOUNTER — Telehealth: Payer: Self-pay | Admitting: Plastic Surgery

## 2023-03-30 NOTE — Telephone Encounter (Signed)
provider is in sx, called 03-30-23 to see if 11--21 thurs will work, I had to lvmail for the pt, If this does not work we can move it, Dr D will not be in office 04-02-23

## 2023-03-30 NOTE — Telephone Encounter (Signed)
Pt called back and agreed to coming in on 04-01-23 Thursday at 1:15pm

## 2023-04-01 ENCOUNTER — Ambulatory Visit (INDEPENDENT_AMBULATORY_CARE_PROVIDER_SITE_OTHER): Payer: Self-pay | Admitting: Plastic Surgery

## 2023-04-01 DIAGNOSIS — L989 Disorder of the skin and subcutaneous tissue, unspecified: Secondary | ICD-10-CM

## 2023-04-01 MED ORDER — VALACYCLOVIR HCL 500 MG PO TABS: 500.0000 mg | ORAL_TABLET | Freq: Two times a day (BID) | ORAL | 0 refills | Status: AC

## 2023-04-01 NOTE — Progress Notes (Signed)
   [facility] [address] [place], [place] [address] Phone: [phone] Fax: [phone]  Cosmetic Procedure Quote   Date: 04/01/2023   Patient:   Christy George Date of Birth:   1981/05/04  Procedure:    Removal of bilateral breast implants and replacement with Silicone.      Place of service:  [facility]       Estimated time of procedure: 2 hr    Surgeon Fee:  $ 4,500                     OR Fee:  $ 1,900  Anesthesia Fee:  $ 1,300  Other:   Skinuva scar cream $96.30 (recommended-but not included in quote)    Silicone implants to be paid to [facility] $2,400  Total: $ 10,100    A $500 security deposit is due at booking Provider fee is due in full at your Pre-operative appointment Quotes are valid for 3 months     Patient signature: ________________________________ Date: _______________________

## 2023-04-01 NOTE — Progress Notes (Signed)
The patient is a 42 year old female here for excision of a lesion on her right face.  However she has 2 fever blisters.  Due to that I do not want to take the chance of getting this area infected.  It is going to be better to wait in the meantime I will send in a prescription for Valtrex for her.  She would also like a quote for a redo breast augmentation.

## 2023-04-02 ENCOUNTER — Encounter: Payer: Self-pay | Admitting: Plastic Surgery

## 2023-04-02 ENCOUNTER — Ambulatory Visit: Payer: Self-pay | Admitting: Plastic Surgery

## 2023-05-21 ENCOUNTER — Telehealth: Payer: Self-pay | Admitting: Plastic Surgery

## 2023-05-21 NOTE — Telephone Encounter (Signed)
 Pt called and is unable to do the procedure right now, Pt will call back at a later time if she wants to r/s

## 2023-06-01 ENCOUNTER — Ambulatory Visit: Payer: Self-pay | Admitting: Plastic Surgery

## 2024-03-30 ENCOUNTER — Encounter: Payer: Self-pay | Admitting: Obstetrics and Gynecology

## 2024-03-30 ENCOUNTER — Other Ambulatory Visit: Payer: Self-pay | Admitting: Obstetrics and Gynecology

## 2024-03-30 DIAGNOSIS — N6321 Unspecified lump in the left breast, upper outer quadrant: Secondary | ICD-10-CM

## 2024-04-17 ENCOUNTER — Inpatient Hospital Stay
Admission: RE | Admit: 2024-04-17 | Discharge: 2024-04-17 | Payer: Self-pay | Attending: Obstetrics and Gynecology | Admitting: Obstetrics and Gynecology

## 2024-04-17 DIAGNOSIS — N6321 Unspecified lump in the left breast, upper outer quadrant: Secondary | ICD-10-CM

## 2024-05-05 ENCOUNTER — Other Ambulatory Visit: Payer: Self-pay
